# Patient Record
Sex: Male | Born: 1985
Health system: Southern US, Community
[De-identification: ages and names within clinical notes are randomized; demographics above are authoritative.]

## PROBLEM LIST (undated history)

## (undated) DIAGNOSIS — B20 Human immunodeficiency virus [HIV] disease: Secondary | ICD-10-CM

---
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
## 2006-03-30 ENCOUNTER — Ambulatory Visit (HOSPITAL_COMMUNITY): Payer: Self-pay | Admitting: Psychiatry

## 2015-04-02 ENCOUNTER — Emergency Department (HOSPITAL_COMMUNITY)
Admission: EM | Admit: 2015-04-02 | Discharge: 2015-04-02 | Disposition: A | Discharge status: Home / Self Care | Payer: BLUE CROSS/BLUE SHIELD | Attending: Emergency Medicine | Admitting: Emergency Medicine

## 2015-04-02 ENCOUNTER — Encounter (HOSPITAL_COMMUNITY): Payer: Self-pay | Admitting: Emergency Medicine

## 2015-04-02 DIAGNOSIS — S79922A Unspecified injury of left thigh, initial encounter: Secondary | ICD-10-CM | POA: Diagnosis present

## 2015-04-02 DIAGNOSIS — Z720 Tobacco use: Secondary | ICD-10-CM | POA: Diagnosis not present

## 2015-04-02 DIAGNOSIS — Y998 Other external cause status: Secondary | ICD-10-CM | POA: Insufficient documentation

## 2015-04-02 DIAGNOSIS — S70312A Abrasion, left thigh, initial encounter: Secondary | ICD-10-CM | POA: Diagnosis not present

## 2015-04-02 DIAGNOSIS — Y92410 Unspecified street and highway as the place of occurrence of the external cause: Secondary | ICD-10-CM | POA: Insufficient documentation

## 2015-04-02 DIAGNOSIS — Y9389 Activity, other specified: Secondary | ICD-10-CM | POA: Insufficient documentation

## 2015-04-02 MED ORDER — IBUPROFEN 800 MG PO TABS: 800.0000 mg | ORAL_TABLET | Freq: Three times a day (TID) | ORAL | Status: DC

## 2015-04-02 MED ORDER — METHOCARBAMOL 500 MG PO TABS: 1000.0000 mg | ORAL_TABLET | Freq: Three times a day (TID) | ORAL | Status: DC | PRN
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
## 2015-04-09 ENCOUNTER — Emergency Department (HOSPITAL_COMMUNITY)
Admission: EM | Admit: 2015-04-09 | Discharge: 2015-04-10 | Disposition: A | Discharge status: Home / Self Care | Payer: BLUE CROSS/BLUE SHIELD | Attending: Emergency Medicine | Admitting: Emergency Medicine

## 2015-04-09 ENCOUNTER — Encounter (HOSPITAL_COMMUNITY): Payer: Self-pay | Admitting: Emergency Medicine

## 2015-04-09 DIAGNOSIS — L0231 Cutaneous abscess of buttock: Secondary | ICD-10-CM | POA: Diagnosis present

## 2015-04-09 DIAGNOSIS — Z720 Tobacco use: Secondary | ICD-10-CM | POA: Insufficient documentation

## 2015-04-09 NOTE — ED Notes (Signed)
Pt states he has an abscess on his coccyx area for 3 days

## 2015-04-10 MED ORDER — HYDROCODONE-ACETAMINOPHEN 5-325 MG PO TABS: 2.0000 | ORAL_TABLET | ORAL | Status: DC | PRN

## 2015-04-10 MED ORDER — BUPIVACAINE-EPINEPHRINE (PF) 0.5% -1:200000 IJ SOLN
10.0000 mL | Freq: Once | INTRAMUSCULAR | Status: AC
  Administered 2015-04-10: 10 mL
  Filled 2015-04-10: qty 30

## 2015-04-10 MED ORDER — CEPHALEXIN 500 MG PO CAPS: 500.0000 mg | ORAL_CAPSULE | Freq: Four times a day (QID) | ORAL | Status: DC

## 2015-04-10 MED ORDER — OXYCODONE-ACETAMINOPHEN 5-325 MG PO TABS: 2.0000 | ORAL_TABLET | Freq: Once | ORAL | Status: DC

## 2015-04-10 MED ORDER — CEPHALEXIN 500 MG PO CAPS
500.0000 mg | ORAL_CAPSULE | Freq: Once | ORAL | Status: AC
  Administered 2015-04-10: 500 mg via ORAL
  Filled 2015-04-10: qty 1

## 2015-04-10 NOTE — ED Notes (Signed)
Suture cart placed at bedside. 

## 2015-04-10 NOTE — Discharge Instructions (Signed)
Abscess Take antibiotics as prescribed.  Apply warm compresses.  Recheck within 48 hours.  Return sooner if you have a fever, difficulty with bowel movement, rectal discharge, or if the abscess does not improve within 48 hours.  An abscess is an infected area that contains a collection of pus and debris.It can occur in almost any part of the body. An abscess is also known as a furuncle or boil. CAUSES  An abscess occurs when tissue gets infected. This can occur from blockage of oil or sweat glands, infection of hair follicles, or a minor injury to the skin. As the body tries to fight the infection, pus collects in the area and creates pressure under the skin. This pressure causes pain. People with weakened immune systems have difficulty fighting infections and get certain abscesses more often.  SYMPTOMS Usually an abscess develops on the skin and becomes a painful mass that is red, warm, and tender. If the abscess forms under the skin, you may feel a moveable soft area under the skin. Some abscesses break open (rupture) on their own, but most will continue to get worse without care. The infection can spread deeper into the body and eventually into the bloodstream, causing you to feel ill.  DIAGNOSIS  Your caregiver will take your medical history and perform a physical exam. A sample of fluid may also be taken from the abscess to determine what is causing your infection. TREATMENT  Your caregiver may prescribe antibiotic medicines to fight the infection. However, taking antibiotics alone usually does not cure an abscess. Your caregiver may need to make a small cut (incision) in the abscess to drain the pus. In some cases, gauze is packed into the abscess to reduce pain and to continue draining the area. HOME CARE INSTRUCTIONS   Only take over-the-counter or prescription medicines for pain, discomfort, or fever as directed by your caregiver.  If you were prescribed antibiotics, take them as directed.  Finish them even if you start to feel better.  If gauze is used, follow your caregiver's directions for changing the gauze.  To avoid spreading the infection:  Keep your draining abscess covered with a bandage.  Wash your hands well.  Do not share personal care items, towels, or whirlpools with others.  Avoid skin contact with others.  Keep your skin and clothes clean around the abscess.  Keep all follow-up appointments as directed by your caregiver. SEEK MEDICAL CARE IF:   You have increased pain, swelling, redness, fluid drainage, or bleeding.  You have muscle aches, chills, or a general ill feeling.  You have a fever. MAKE SURE YOU:   Understand these instructions.  Will watch your condition.  Will get help right away if you are not doing well or get worse. Document Released: 08/12/2005 Document Revised: 05/03/2012 Document Reviewed: 01/15/2012 [facility] Patient Information 2015 [place], [place]. This information is not intended to replace advice given to you by your health care provider. Make sure you discuss any questions you have with your health care provider.

## 2015-04-10 NOTE — ED Provider Notes (Signed)
CSN: [id number]     Arrival date & time 04/09/15  2209 History   None    Chief Complaint  Patient presents with  . Abscess     (Consider location/radiation/quality/duration/timing/severity/associated sxs/prior Treatment) HPI [name] is a 29 y.o male who presents for left buttox abscess that he noticed 4 days ago. He states it has worsened for the past 2 days.  Worse with sitting and walking.  He states that he tried to soak in a hot tub and apply warm compresses to the area.  He has had these before and the last one was on his abdomen. 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 He denies any fever, chills, abdominal pain, nausea, vomiting, difficulty with bowel or bladder movements, rectal pain, or rectal bleeding/drainage.  History reviewed. No pertinent past medical history. History reviewed. No pertinent past surgical history. Family History  Problem Relation Age of Onset  . Adopted: Yes   History  Substance Use Topics  . Smoking status: Current Every Day Smoker -- 1.00 packs/day    Types: Cigarettes  . Smokeless tobacco: Not on file  . Alcohol Use: No    Review of Systems  Constitutional: Negative for fever and chills.  Gastrointestinal: Negative for nausea, vomiting, abdominal pain, diarrhea, constipation, blood in stool, anal bleeding and rectal pain.  Skin: Positive for wound.      Allergies  Elocon  Home Medications   Prior to Admission medications   Medication Sig Start Date End Date Taking? Authorizing Provider  neomycin-bacitracin-polymyxin (NEOSPORIN) ointment Apply 1 application topically as needed for wound care. apply to eye   Yes Historical Provider, MD  cephALEXin (KEFLEX) 500 MG capsule Take 1 capsule (500 mg total) by mouth 4 (four) times daily. 04/10/15   Linzi Ohlinger Patel-Mills, PA-C  HYDROcodone-acetaminophen (NORCO/VICODIN) 5-325 MG per tablet Take 2 tablets by mouth every 4 (four) hours as needed. 04/10/15    Franci Oshana Patel-Mills, PA-C  ibuprofen (ADVIL,MOTRIN) 800 MG tablet Take 1 tablet (800 mg total) by mouth 3 (three) times daily. Patient not taking: Reported on 04/10/2015 04/02/15   Ladona MowJoe Mintz, PA-C  methocarbamol (ROBAXIN) 500 MG tablet Take 2 tablets (1,000 mg total) by mouth every 8 (eight) hours as needed for muscle spasms. Patient not taking: Reported on 04/10/2015 04/02/15   Ladona MowJoe Mintz, PA-C   BP 133/98 mmHg  Pulse 73  Temp(Src) 98.2 F (36.8 C) (Oral)  Resp 16  SpO2 100% Physical Exam  Constitutional: He is oriented to person, place, and time. He appears well-developed and well-nourished.  HENT:  Head: Normocephalic.  Eyes: Conjunctivae are normal.  Neck: Neck supple.  Cardiovascular: Normal rate.   Pulmonary/Chest: Effort normal.  Abdominal: Soft. There is no tenderness.  Musculoskeletal: Normal range of motion.  Neurological: He is alert and oriented to person, place, and time.  Skin: Skin is warm.  4cm round indurated area along the left gluteal fold with tenderness and warmth to palpation. No erythema or fluctuance.     ED Course  INCISION AND DRAINAGE Date/Time: 04/10/2015 1:40 PM Performed by: Catha GosselinPATEL-MILLS, Milton Streicher Authorized by: Catha GosselinPATEL-MILLS, Kenedee Molesky Consent: Verbal consent obtained. Risks and benefits: risks, benefits and alternatives were discussed Consent given by: patient Patient understanding: patient states understanding of the procedure being performed Patient consent: the patient's understanding of the procedure matches consent given Patient identity confirmed: verbally with patient Type: abscess Location: left gluteal fold. Anesthesia: local infiltration Local anesthetic: bupivacaine 0.5% with epinephrine Anesthetic  total: 2 ml Scalpel size: 11 Incision type: single straight Complexity: simple Drainage: bloody Drainage amount: scant Wound treatment: wound left open Packing material: none Patient tolerance: Patient tolerated the procedure well with no  immediate complications Comments: There was no purulent drainage.    (including critical care time) Labs Review Labs Reviewed - No data to display  Imaging Review No results found.   EKG Interpretation None      MDM   Final diagnoses:  Abscess, gluteal, left  Patient presents for left gluteal fold abscess x 4 days.  Patient refused rectal exam.  I thoroughly explained the need for the rectal exam. He stated he did not have rectal pain, rectal bleeding, rectal discharge or mucus, or difficulty passing stool.  I tried to lance the abscess but there was no drainage. I do not believe the abscess goes into the rectum at this time but is a likelihood if the abscess continues to grow. He is currently afebrile.   I put him on percocet for pain and keflex and gave him the first dose in the ED.  I explained that he needed to avoid wiping stool matter into the open incision.  He also needs close follow up within 48 hours. He should return for recheck within 24-48 hours as well as returning for worsening symptoms such as fever, no improvement in abscess, increase in size of abscess, or rectal pain.  He agreed with the plan.    Catha Gosselin, PA-C 04/10/15 1343  Tomasita Crumble, MD 04/10/15 [phone]

## 2015-04-10 NOTE — ED Notes (Signed)
PA at bedside.

## 2016-06-14 ENCOUNTER — Encounter (HOSPITAL_COMMUNITY): Payer: Self-pay

## 2016-06-14 ENCOUNTER — Emergency Department (HOSPITAL_COMMUNITY)
Admission: EM | Admit: 2016-06-14 | Discharge: 2016-06-14 | Disposition: A | Discharge status: Home / Self Care | Payer: BLUE CROSS/BLUE SHIELD | Attending: Emergency Medicine | Admitting: Emergency Medicine

## 2016-06-14 DIAGNOSIS — L02214 Cutaneous abscess of groin: Secondary | ICD-10-CM | POA: Insufficient documentation

## 2016-06-14 DIAGNOSIS — Z21 Asymptomatic human immunodeficiency virus [HIV] infection status: Secondary | ICD-10-CM | POA: Diagnosis not present

## 2016-06-14 DIAGNOSIS — Z792 Long term (current) use of antibiotics: Secondary | ICD-10-CM | POA: Insufficient documentation

## 2016-06-14 DIAGNOSIS — F17210 Nicotine dependence, cigarettes, uncomplicated: Secondary | ICD-10-CM | POA: Insufficient documentation

## 2016-06-14 DIAGNOSIS — Y929 Unspecified place or not applicable: Secondary | ICD-10-CM | POA: Insufficient documentation

## 2016-06-14 DIAGNOSIS — L0291 Cutaneous abscess, unspecified: Secondary | ICD-10-CM

## 2016-06-14 DIAGNOSIS — S00431A Contusion of right ear, initial encounter: Secondary | ICD-10-CM | POA: Insufficient documentation

## 2016-06-14 DIAGNOSIS — Z791 Long term (current) use of non-steroidal anti-inflammatories (NSAID): Secondary | ICD-10-CM | POA: Insufficient documentation

## 2016-06-14 DIAGNOSIS — Y999 Unspecified external cause status: Secondary | ICD-10-CM | POA: Insufficient documentation

## 2016-06-14 DIAGNOSIS — X58XXXA Exposure to other specified factors, initial encounter: Secondary | ICD-10-CM | POA: Diagnosis not present

## 2016-06-14 DIAGNOSIS — Y939 Activity, unspecified: Secondary | ICD-10-CM | POA: Diagnosis not present

## 2016-06-14 DIAGNOSIS — H9201 Otalgia, right ear: Secondary | ICD-10-CM | POA: Diagnosis present

## 2016-06-14 HISTORY — DX: Human immunodeficiency virus (HIV) disease: B20

## 2016-06-14 MED ORDER — HYDROCODONE-ACETAMINOPHEN 5-325 MG PO TABS: 1.0000 | ORAL_TABLET | Freq: Four times a day (QID) | ORAL | 0 refills | Status: DC | PRN

## 2016-06-14 MED ORDER — LIDOCAINE-EPINEPHRINE 2 %-1:100000 IJ SOLN: 10.0000 mL | Freq: Once | INTRAMUSCULAR | Status: DC

## 2016-06-14 MED ORDER — LIDOCAINE-EPINEPHRINE 2 %-1:100000 IJ SOLN
INTRAMUSCULAR | Status: AC
  Administered 2016-06-14: 10 mL
  Filled 2016-06-14: qty 1

## 2016-06-14 MED ORDER — BACITRACIN ZINC 500 UNIT/GM EX OINT
TOPICAL_OINTMENT | Freq: Two times a day (BID) | CUTANEOUS | Status: DC
  Administered 2016-06-14: 1 via TOPICAL
  Filled 2016-06-14: qty 28.35

## 2016-06-14 MED ORDER — OXYCODONE-ACETAMINOPHEN 5-325 MG PO TABS
1.0000 | ORAL_TABLET | Freq: Once | ORAL | Status: AC
  Administered 2016-06-14: 1 via ORAL
  Filled 2016-06-14: qty 1

## 2016-06-14 MED ORDER — NAPROXEN 500 MG PO TABS: 500.0000 mg | ORAL_TABLET | Freq: Two times a day (BID) | ORAL | 0 refills | Status: DC

## 2016-06-14 NOTE — ED Provider Notes (Signed)
[facility]-EMERGENCY DEPT Provider Note   CSN: [id number] Arrival date & time: 06/14/16  4098  First Provider Contact:  First MD Initiated Contact with Patient 06/14/16 2016        By signing my name below, I, Doreatha Martin, attest that this documentation has been prepared under the direction and in the presence of Shawn Joy, PA-C. Electronically Signed: Doreatha Martin, ED Scribe. 06/14/16. 8:58 PM.   History   Chief Complaint Chief Complaint  Patient presents with  . Abscess    HPI Ronald Peck is a 30 y.o. male with h/o HIV who presents to the Emergency Department complaining of a moderate, gradually worsening area of pain and swelling behind the right ear onset over a week ago. Pt also reports he had a painful, swollen area to the right groin, which was lanced 2.5 weeks ago. Pt states he was started on antibiotics at that time, but lost them, so he was then started on Doxycycline 3 days ago after being seen at [facility]. Pt states he was prescribed 10 days of Doxycycline. He reports the area behind his ear was evaluated at this time as well and they did not proceed with I&D. Pt states pain is worsened with palpation and direct pressure. He denies fever, chills, nausea, emesis, drainage from the area. He reports he is followed by [facility] for management of his HIV. Pt states he last had his viral load checked 4 months ago and he is not currently taking any antivirals because he refuses them. States his viral load and CD4 count were both "great," but cannot say what the actual values were.    The history is provided by the patient. No language interpreter was used.    Past Medical History:  Diagnosis Date  . HIV (human immunodeficiency virus infection) (HCC)     There are no active problems to display for this patient.   History reviewed. No pertinent surgical history.  OB History    No data available       Home Medications    Prior to Admission medications   Medication Sig  Start Date End Date Taking? Authorizing Provider  cephALEXin (KEFLEX) 500 MG capsule Take 1 capsule (500 mg total) by mouth 4 (four) times daily. 04/10/15   Hanna Patel-Mills, PA-C  HYDROcodone-acetaminophen (NORCO/VICODIN) 5-325 MG per tablet Take 2 tablets by mouth every 4 (four) hours as needed. 04/10/15   Hanna Patel-Mills, PA-C  HYDROcodone-acetaminophen (NORCO/VICODIN) 5-325 MG tablet Take 1 tablet by mouth every 6 (six) hours as needed. 06/14/16   Shawn C Joy, PA-C  ibuprofen (ADVIL,MOTRIN) 800 MG tablet Take 1 tablet (800 mg total) by mouth 3 (three) times daily. Patient not taking: Reported on 04/10/2015 04/02/15   Ladona Mow, PA-C  naproxen (NAPROSYN) 500 MG tablet Take 1 tablet (500 mg total) by mouth 2 (two) times daily. 06/14/16   Shawn C Joy, PA-C  neomycin-bacitracin-polymyxin (NEOSPORIN) ointment Apply 1 application topically as needed for wound care. apply to eye    Historical Provider, MD    Family History Family History  Problem Relation Age of Onset  . Adopted: Yes    Social History Social History  Substance Use Topics  . Smoking status: Current Every Day Smoker    Packs/day: 1.00    Types: Cigarettes  . Smokeless tobacco: Never Used  . Alcohol use No     Allergies   Elocon [mometasone furoate]   Review of Systems Review of Systems  Constitutional: Negative for chills, fever and  unexpected weight change.  Gastrointestinal: Negative for nausea and vomiting.  Skin:       +area of pain and swelling behind right ear, to right groin   All other systems reviewed and are negative.    Physical Exam Updated Vital Signs BP 127/76 (BP Location: Left Arm)   Pulse 73   Temp 98.6 F (37 C) (Oral)   Resp 20   SpO2 100%   Physical Exam  Constitutional: He appears well-developed and well-nourished. No distress.  HENT:  Head: Normocephalic and atraumatic.  Left Ear: External ear normal.  Just inferior to the right ear, he has tenderness and induration. Area of  swelling and tenderness to the right earlobe. No active drainage. No area of fluctuance.   Eyes: Conjunctivae are normal.  Neck: Neck supple.  Cardiovascular: Normal rate, regular rhythm, normal heart sounds and intact distal pulses.   Pulmonary/Chest: Effort normal and breath sounds normal. No respiratory distress.  Abdominal: Soft. He exhibits no distension. There is no tenderness. There is no guarding.  Musculoskeletal: Normal range of motion. He exhibits no edema or tenderness.  Lymphadenopathy:    He has no cervical adenopathy.       Right: No inguinal adenopathy present.       Left: No inguinal adenopathy present.  Neurological: He is alert.  Skin: Skin is warm and dry. He is not diaphoretic. There is erythema.  On the right groin, he has a small fluctuant abscess in the right inguinal region with associated tenderness and minimal surrounding erythema. Scribe served as Biomedical engineer during the exam.  Psychiatric: He has a normal mood and affect. His behavior is normal.  Nursing note and vitals reviewed.    ED Treatments / Results  Labs (all labs ordered are listed, but only abnormal results are displayed) Labs Reviewed - No data to display  EKG  EKG Interpretation None       Radiology No results found.  Procedures .Marland KitchenIncision and Drainage Date/Time: 06/14/2016 8:51 PM Performed by: Anselm Pancoast Authorized by: Anselm Pancoast   Consent:    Consent obtained:  Verbal   Consent given by:  Patient   Risks discussed:  Bleeding, incomplete drainage, infection and pain   Alternatives discussed:  No treatment Universal protocol:    Procedure explained and questions answered to patient or proxy's satisfaction: yes     Immediately prior to procedure a time out was called: yes     Patient identity confirmed:  Verbally with patient, arm band and hospital-assigned identification number Location:    Type:  Abscess   Location: right inguinal region. Pre-procedure details:    Skin  preparation:  Betadine Anesthesia (see MAR for exact dosages):    Anesthesia method:  Local infiltration   Local anesthetic:  Lidocaine 2% WITH epi Procedure type:    Complexity:  Complex Procedure details:    Needle aspiration: no     Incision types:  Single straight   Incision depth:  Subcutaneous   Scalpel blade:  11   Wound management:  Probed and deloculated   Drainage:  Bloody and purulent   Drainage amount:  Moderate   Wound treatment:  Wound left open   Packing materials:  None Post-procedure details:    Patient tolerance of procedure:  Tolerated well, no immediate complications   (including critical care time)  DIAGNOSTIC STUDIES: Oxygen Saturation is 100% on RA, normal by my interpretation.    COORDINATION OF CARE: 8:20 PM Discussed treatment plan with pt at bedside  which includes I&D and pt agreed to plan.    Medications Ordered in ED Medications  bacitracin ointment (1 application Topical Given 06/14/16 2115)  lidocaine-EPINEPHrine (XYLOCAINE W/EPI) 2 %-1:100000 (with pres) injection (10 mLs  Given by Other 06/14/16 2036)  oxyCODONE-acetaminophen (PERCOCET/ROXICET) 5-325 MG per tablet 1 tablet (1 tablet Oral Given 06/14/16 2115)     Initial Impression / Assessment and Plan / ED Course  I have reviewed the triage vital signs and the nursing notes.  Pertinent labs & imaging results that were available during my care of the patient were reviewed by me and considered in my medical decision making (see chart for details).  Clinical Course    Bronsyn Peck presents with 2 areas of pain, both arising over a week ago.  Groin abscess drained successfully. Chart review reveals patient's last HIV labs were performed April 2017. HIV Quant 771 and CD4 count 2300. He has what looks to be possibly what looks like a auricular hematoma on the right ear, but d/t the duration of time and location, ENT f/u is indicated. Patient has no signs of systemic illness, to include no signs of  sepsis. ENT follow-up resource given. The patient was given instructions for home care as well as return precautions. Patient voices understanding of these instructions, accepts the plan, and is comfortable with discharge.  Vitals:   06/14/16 1905 06/14/16 2121  BP: 152/88 127/76  Pulse: 97 73  Resp: 20 20  Temp: 98.6 F (37 C)   TempSrc: Oral   SpO2: 100% 100%     Final Clinical Impressions(s) / ED Diagnoses   Final diagnoses:  Abscess  Hematoma of auricle, right, initial encounter    New Prescriptions Discharge Medication List as of 06/14/2016  9:11 PM    START taking these medications   Details  !! HYDROcodone-acetaminophen (NORCO/VICODIN) 5-325 MG tablet Take 1 tablet by mouth every 6 (six) hours as needed., Starting Sun 06/14/2016, Print    naproxen (NAPROSYN) 500 MG tablet Take 1 tablet (500 mg total) by mouth 2 (two) times daily., Starting Sun 06/14/2016, Print     !! - Potential duplicate medications found. Please discuss with provider.      I personally performed the services described in this documentation, which was scribed in my presence. The recorded information has been reviewed and is accurate.    Anselm Pancoast, PA-C 06/14/16 2146    Tilden Fossa, MD 06/15/16 [phone]

## 2016-06-14 NOTE — Discharge Instructions (Signed)
Follow the attached instructions for care of the abscess following incision and drainage. Due to the location of the area of swelling near the ear, a course of antibiotics and ENT follow-up is indicated. Continue taking the doxycycline as previously prescribed. Follow up with ENT as soon as possible. Call the number provided to set up an appointment. Ibuprofen, naproxen, or Tylenol for pain. Vicodin for severe pain. Do not drive or perform other dangerous activities while taking the Vicodin.

## 2016-06-14 NOTE — ED Notes (Signed)
PT DISCHARGED. INSTRUCTIONS AND PRESCRIPTIONS GIVEN. AAOX4. PT IN NO APPARENT DISTRESS. THE OPPORTUNITY TO ASK QUESTIONS WAS PROVIDED. 

## 2016-06-14 NOTE — ED Triage Notes (Signed)
Two weeks ago pt had groin abscess.  Lanced and packed.  Continued problem with site.  Also site on ear.  Given antibiotics.  Doesn't like meds so did not take pain meds.  Ear site was said to have not come to a head so no lancing by urgent care.

## 2017-02-03 ENCOUNTER — Emergency Department (HOSPITAL_COMMUNITY)
Admission: EM | Admit: 2017-02-03 | Discharge: 2017-02-03 | Disposition: A | Discharge status: Home / Self Care | Payer: BLUE CROSS/BLUE SHIELD | Attending: Emergency Medicine | Admitting: Emergency Medicine

## 2017-02-03 ENCOUNTER — Emergency Department (HOSPITAL_COMMUNITY): Payer: BLUE CROSS/BLUE SHIELD

## 2017-02-03 ENCOUNTER — Encounter (HOSPITAL_COMMUNITY): Payer: Self-pay | Admitting: *Deleted

## 2017-02-03 DIAGNOSIS — W268XXA Contact with other sharp object(s), not elsewhere classified, initial encounter: Secondary | ICD-10-CM | POA: Insufficient documentation

## 2017-02-03 DIAGNOSIS — Y939 Activity, unspecified: Secondary | ICD-10-CM | POA: Diagnosis not present

## 2017-02-03 DIAGNOSIS — T1490XA Injury, unspecified, initial encounter: Secondary | ICD-10-CM

## 2017-02-03 DIAGNOSIS — Y929 Unspecified place or not applicable: Secondary | ICD-10-CM | POA: Diagnosis not present

## 2017-02-03 DIAGNOSIS — Z21 Asymptomatic human immunodeficiency virus [HIV] infection status: Secondary | ICD-10-CM | POA: Insufficient documentation

## 2017-02-03 DIAGNOSIS — Y999 Unspecified external cause status: Secondary | ICD-10-CM | POA: Insufficient documentation

## 2017-02-03 DIAGNOSIS — F17210 Nicotine dependence, cigarettes, uncomplicated: Secondary | ICD-10-CM | POA: Insufficient documentation

## 2017-02-03 DIAGNOSIS — S91111A Laceration without foreign body of right great toe without damage to nail, initial encounter: Secondary | ICD-10-CM | POA: Diagnosis present

## 2017-02-03 MED ORDER — LIDOCAINE HCL (PF) 1 % IJ SOLN
INTRAMUSCULAR | Status: AC
  Administered 2017-02-03: 5 mL via INTRADERMAL
  Filled 2017-02-03: qty 5

## 2017-02-03 MED ORDER — LIDOCAINE HCL (PF) 1 % IJ SOLN
5.0000 mL | Freq: Once | INTRAMUSCULAR | Status: AC
  Administered 2017-02-03: 5 mL via INTRADERMAL
  Filled 2017-02-03: qty 5

## 2017-02-03 MED ORDER — IBUPROFEN 800 MG PO TABS: 800.0000 mg | ORAL_TABLET | Freq: Three times a day (TID) | ORAL | 0 refills | Status: DC

## 2017-02-03 MED ORDER — OXYCODONE-ACETAMINOPHEN 5-325 MG PO TABS
1.0000 | ORAL_TABLET | Freq: Once | ORAL | Status: AC
  Administered 2017-02-03: 1 via ORAL
  Filled 2017-02-03: qty 1

## 2017-02-03 MED ORDER — LIDOCAINE HCL (PF) 1 % IJ SOLN
5.0000 mL | Freq: Once | INTRAMUSCULAR | Status: AC
  Administered 2017-02-03: 5 mL via INTRADERMAL

## 2017-02-03 MED ORDER — CEPHALEXIN 500 MG PO CAPS: 500.0000 mg | ORAL_CAPSULE | Freq: Three times a day (TID) | ORAL | 0 refills | Status: DC

## 2017-02-03 NOTE — Discharge Instructions (Signed)
Clean the wound with mild soap and water and keep it bandaged.  Sutures out in 10 days.  Return here for any signs of infection

## 2017-02-03 NOTE — ED Provider Notes (Signed)
[facility]-EMERGENCY DEPT Provider Note   CSN: [id number] Arrival date & time: 02/03/17  1206     History   Chief Complaint Chief Complaint  Patient presents with  . Toe Injury    HPI Ronald Peck is a 31 y.o. male.  HPI   Ronald Peck is a 31 y.o. male with PMHx of HIV, who presents to the Emergency Department complaining of laceration to his right third toe.  He states that he tripped on a piece of broken tile which cut his toe.  He describes a throbbing pain to the toe.  He denies numbness, swelling or difficulty moving the toe.  Td is up to date. Denies anti-coagulants.    Past Medical History:  Diagnosis Date  . HIV (human immunodeficiency virus infection) (HCC)     There are no active problems to display for this patient.   History reviewed. No pertinent surgical history.     Home Medications    Prior to Admission medications   Not on File    Family History Family History  Problem Relation Age of Onset  . Adopted: Yes    Social History Social History  Substance Use Topics  . Smoking status: Current Every Day Smoker    Packs/day: 1.00    Types: Cigarettes  . Smokeless tobacco: Never Used  . Alcohol use No     Allergies   Elocon [mometasone furoate]   Review of Systems Review of Systems  Constitutional: Negative for chills and fever.  Musculoskeletal: Negative for arthralgias (right third toe pain), back pain and joint swelling.  Skin: Positive for wound.       Laceration right third toe  Neurological: Negative for dizziness, weakness and numbness.  Hematological: Does not bruise/bleed easily.  All other systems reviewed and are negative.    Physical Exam Updated Vital Signs BP (!) 173/86   Pulse 63   Temp 97.9 F (36.6 C) (Oral)   Resp 16   Ht 6\' 5"  (1.956 m)   Wt (!) 147.4 kg   SpO2 98%   BMI 38.54 kg/m   Physical Exam  Constitutional: He is oriented to person, place, and time. He appears well-developed and well-nourished. No  distress.  HENT:  Head: Atraumatic.  Cardiovascular: Normal rate, regular rhythm and intact distal pulses.   No murmur heard. Pulmonary/Chest: Effort normal and breath sounds normal. No respiratory distress.  Musculoskeletal: Normal range of motion. He exhibits tenderness. He exhibits no edema or deformity.       Right foot: There is laceration. There is normal range of motion, no bony tenderness, no swelling, normal capillary refill and no deformity.       Feet:  Tenderness with ROM with 3.5 cm laceration to dorsal right third toe that extends into the web space.  Bleeding controlled. No tendon or bony deformity seen.  No FB's  Neurological: He is alert and oriented to person, place, and time. He exhibits normal muscle tone. Coordination normal.  Skin: Skin is warm.  Nursing note and vitals reviewed.    ED Treatments / Results  Labs (all labs ordered are listed, but only abnormal results are displayed) Labs Reviewed - No data to display  EKG  EKG Interpretation None       Radiology Dg Toe 3rd Right  Result Date: 02/03/2017 CLINICAL DATA:  Laceration. EXAM: RIGHT THIRD TOE COMPARISON:  No recent prior. FINDINGS: No acute bony or joint abnormality identified. No evidence of fracture or dislocation. IMPRESSION: No acute bony or joint  abnormality. Electronically Signed   By: Maisie Fus  Register   On: 02/03/2017 12:58    Procedures Procedures (including critical care time)  LACERATION REPAIR Performed by: Nitish Roes L. Authorized by: Maxwell Caul Consent: Verbal consent obtained. Risks and benefits: risks, benefits and alternatives were discussed Consent given by: patient Patient identity confirmed: provided demographic data Prepped and Draped in normal sterile fashion Wound explored  Laceration Location: right third toe  Laceration Length: 3.5 cm  No Foreign Bodies seen or palpated  Anesthesia: local infiltration  Local anesthetic: lidocaine 1 % w/o  epinephrine  Anesthetic total: 4 ml  Irrigation method: syringe Amount of cleaning: standard  Skin closure: 4-0 prolene  Number of sutures: 8  Technique: simple interrupted  Patient tolerance: Patient tolerated the procedure well with no immediate complications.   Medications Ordered in ED Medications  lidocaine (PF) (XYLOCAINE) 1 % injection (not administered)  oxyCODONE-acetaminophen (PERCOCET/ROXICET) 5-325 MG per tablet 1 tablet (1 tablet Oral Given 02/03/17 1344)  lidocaine (PF) (XYLOCAINE) 1 % injection 5 mL (5 mLs Intradermal Given 02/03/17 1346)     Initial Impression / Assessment and Plan / ED Course  I have reviewed the triage vital signs and the nursing notes.  Pertinent labs & imaging results that were available during my care of the patient were reviewed by me and considered in my medical decision making (see chart for details).     Pt well appearing.  NV intact.  Wound edges well approximated. Bleeding controlled prior to closure. XR neg for fx.  Pt is immunocompromised, so Rx for Keflex.  Wound bandaged and post op shoe applied.  Pt agrees to  Wound care instructions discussed.  sutures out in 10 days.    Final Clinical Impressions(s) / ED Diagnoses   Final diagnoses:  Laceration of right great toe without foreign body present or damage to nail, initial encounter    New Prescriptions New Prescriptions   No medications on file  ou   Rosey Bath 02/03/17 2117    Mancel Bale, MD 02/04/17 [phone]

## 2017-02-03 NOTE — ED Triage Notes (Signed)
Pt comes in by EMS for a toe laceration caused by a tile. Pt is up to date on his tetanus. Pt is HIV positive.

## 2018-04-14 IMAGING — DX DG TOE 3RD 2+V*R*
3 series · 3 of 3 positions shown · non-contrast
Comparison: No recent prior.

CLINICAL DATA: Laceration.

EXAM:
RIGHT THIRD TOE

[toe ap]
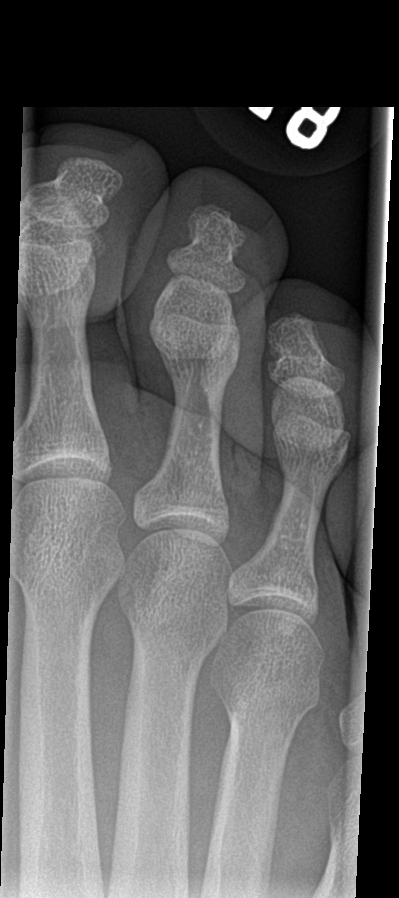

[toe obl]
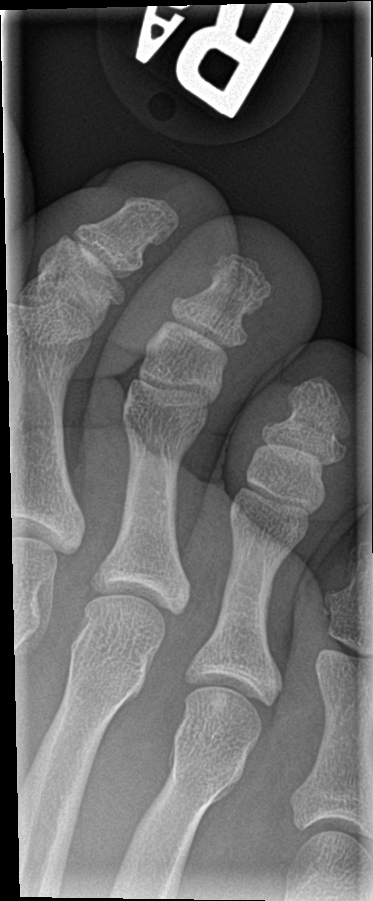

[toe lat]
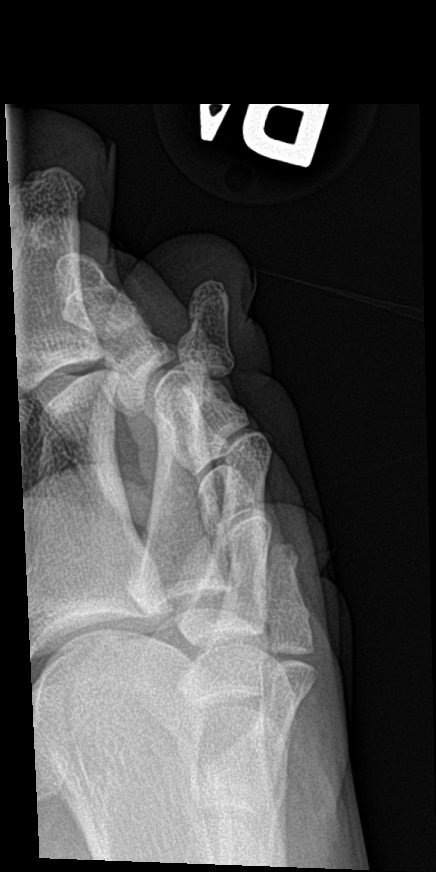

[3 of 3 positions shown; findings below may reference images not displayed]

FINDINGS: No acute bony or joint abnormality identified. No evidence of
fracture or dislocation.
IMPRESSION: No acute bony or joint abnormality.

## 2018-12-23 DIAGNOSIS — B999 Unspecified infectious disease: Secondary | ICD-10-CM | POA: Diagnosis not present

## 2018-12-29 DIAGNOSIS — B999 Unspecified infectious disease: Secondary | ICD-10-CM | POA: Diagnosis not present

## 2019-01-12 DIAGNOSIS — L0231 Cutaneous abscess of buttock: Secondary | ICD-10-CM | POA: Diagnosis not present

## 2019-01-12 DIAGNOSIS — L02419 Cutaneous abscess of limb, unspecified: Secondary | ICD-10-CM | POA: Diagnosis not present

## 2019-04-25 DIAGNOSIS — L03112 Cellulitis of left axilla: Secondary | ICD-10-CM | POA: Diagnosis not present

## 2019-04-25 DIAGNOSIS — L03315 Cellulitis of perineum: Secondary | ICD-10-CM | POA: Diagnosis not present

## 2019-05-17 DIAGNOSIS — L03221 Cellulitis of neck: Secondary | ICD-10-CM | POA: Diagnosis not present

## 2019-06-05 DIAGNOSIS — L03221 Cellulitis of neck: Secondary | ICD-10-CM | POA: Diagnosis not present

## 2019-07-23 DIAGNOSIS — L0231 Cutaneous abscess of buttock: Secondary | ICD-10-CM | POA: Diagnosis not present

## 2019-07-23 DIAGNOSIS — L738 Other specified follicular disorders: Secondary | ICD-10-CM | POA: Diagnosis not present

## 2019-07-25 DIAGNOSIS — L0231 Cutaneous abscess of buttock: Secondary | ICD-10-CM | POA: Diagnosis not present

## 2020-02-07 DIAGNOSIS — L02219 Cutaneous abscess of trunk, unspecified: Secondary | ICD-10-CM | POA: Diagnosis not present

## 2020-02-07 DIAGNOSIS — H70011 Subperiosteal abscess of mastoid, right ear: Secondary | ICD-10-CM | POA: Diagnosis not present

## 2020-02-07 DIAGNOSIS — L0501 Pilonidal cyst with abscess: Secondary | ICD-10-CM | POA: Diagnosis not present

## 2020-02-13 DIAGNOSIS — L0291 Cutaneous abscess, unspecified: Secondary | ICD-10-CM | POA: Diagnosis not present

## 2020-04-07 DIAGNOSIS — L02421 Furuncle of right axilla: Secondary | ICD-10-CM | POA: Diagnosis not present

## 2020-04-07 DIAGNOSIS — L02422 Furuncle of left axilla: Secondary | ICD-10-CM | POA: Diagnosis not present

## 2020-04-27 DIAGNOSIS — G510 Bell's palsy: Secondary | ICD-10-CM | POA: Diagnosis not present

## 2020-05-13 DIAGNOSIS — L03211 Cellulitis of face: Secondary | ICD-10-CM | POA: Diagnosis not present

## 2020-06-17 DIAGNOSIS — L03111 Cellulitis of right axilla: Secondary | ICD-10-CM | POA: Diagnosis not present

## 2020-06-27 DIAGNOSIS — L02421 Furuncle of right axilla: Secondary | ICD-10-CM | POA: Diagnosis not present

## 2020-07-17 DIAGNOSIS — Z20822 Contact with and (suspected) exposure to covid-19: Secondary | ICD-10-CM | POA: Diagnosis not present

## 2020-08-18 DIAGNOSIS — L0292 Furuncle, unspecified: Secondary | ICD-10-CM | POA: Diagnosis not present

## 2020-08-18 DIAGNOSIS — J069 Acute upper respiratory infection, unspecified: Secondary | ICD-10-CM | POA: Diagnosis not present

## 2020-08-18 DIAGNOSIS — Z03818 Encounter for observation for suspected exposure to other biological agents ruled out: Secondary | ICD-10-CM | POA: Diagnosis not present

## 2020-08-23 DIAGNOSIS — J189 Pneumonia, unspecified organism: Secondary | ICD-10-CM | POA: Diagnosis not present

## 2020-08-25 DIAGNOSIS — J040 Acute laryngitis: Secondary | ICD-10-CM | POA: Diagnosis not present

## 2020-08-25 DIAGNOSIS — J189 Pneumonia, unspecified organism: Secondary | ICD-10-CM | POA: Diagnosis not present

## 2020-09-26 ENCOUNTER — Emergency Department (HOSPITAL_COMMUNITY)
Admission: EM | Admit: 2020-09-26 | Discharge: 2020-09-26 | Disposition: A | Discharge status: Home / Self Care | Payer: BC Managed Care – PPO | Attending: Emergency Medicine | Admitting: Emergency Medicine

## 2020-09-26 ENCOUNTER — Encounter (HOSPITAL_COMMUNITY): Payer: Self-pay

## 2020-09-26 ENCOUNTER — Other Ambulatory Visit: Payer: Self-pay

## 2020-09-26 DIAGNOSIS — R739 Hyperglycemia, unspecified: Secondary | ICD-10-CM | POA: Diagnosis not present

## 2020-09-26 DIAGNOSIS — E119 Type 2 diabetes mellitus without complications: Secondary | ICD-10-CM | POA: Diagnosis not present

## 2020-09-26 DIAGNOSIS — F17210 Nicotine dependence, cigarettes, uncomplicated: Secondary | ICD-10-CM | POA: Insufficient documentation

## 2020-09-26 DIAGNOSIS — Z20822 Contact with and (suspected) exposure to covid-19: Secondary | ICD-10-CM | POA: Insufficient documentation

## 2020-09-26 DIAGNOSIS — E1165 Type 2 diabetes mellitus with hyperglycemia: Secondary | ICD-10-CM | POA: Diagnosis not present

## 2020-09-26 LAB — CBG MONITORING, ED
Glucose-Capillary: 367 mg/dL — ABNORMAL HIGH (ref 70–99)
Glucose-Capillary: 295 mg/dL — ABNORMAL HIGH (ref 70–99)
Glucose-Capillary: 308 mg/dL — ABNORMAL HIGH (ref 70–99)

## 2020-09-26 LAB — URINALYSIS, ROUTINE W REFLEX MICROSCOPIC
Bacteria, UA: NONE SEEN
Bilirubin Urine: NEGATIVE
Glucose, UA: >=500 mg/dL — AB
Ketones, ur: 5 mg/dL — AB
Leukocytes,Ua: NEGATIVE
Nitrite: NEGATIVE
Protein, ur: NEGATIVE mg/dL
Specific Gravity, Urine: 1.032 — ABNORMAL HIGH (ref 1.005–1.030)
pH: 6 (ref 5.0–8.0)

## 2020-09-26 LAB — BASIC METABOLIC PANEL
Anion gap: 11 (ref 5–15)
BUN: 13 mg/dL (ref 6–20)
CO2: 22 mmol/L (ref 22–32)
Calcium: 9.6 mg/dL (ref 8.9–10.3)
Chloride: 99 mmol/L (ref 98–111)
Creatinine, Ser: 0.76 mg/dL (ref 0.61–1.24)
GFR, Estimated: >60 mL/min (ref >60)
Glucose, Bld: 406 mg/dL — ABNORMAL HIGH (ref 70–99)
Potassium: 4.7 mmol/L (ref 3.5–5.1)
Sodium: 132 mmol/L — ABNORMAL LOW (ref 135–145)

## 2020-09-26 LAB — HEMOGLOBIN A1C
Hgb A1c MFr Bld: 9.8 % — ABNORMAL HIGH (ref 4.8–5.6)
Mean Plasma Glucose: 234.56 mg/dL

## 2020-09-26 LAB — CBC
HCT: 44.5 % (ref 39.0–52.0)
Hemoglobin: 16 g/dL (ref 13.0–17.0)
MCH: 30.4 pg (ref 26.0–34.0)
MCHC: 36 g/dL (ref 30.0–36.0)
MCV: 84.6 fL (ref 80.0–100.0)
Platelets: 238 10*3/uL (ref 150–400)
RBC: 5.26 MIL/uL (ref 4.22–5.81)
RDW: 12.4 % (ref 11.5–15.5)
WBC: 8.8 10*3/uL (ref 4.0–10.5)
nRBC: 0 % (ref 0.0–0.2)

## 2020-09-26 LAB — RESPIRATORY PANEL BY RT PCR (FLU A&B, COVID)
Influenza A by PCR: NEGATIVE
Influenza B by PCR: NEGATIVE
SARS Coronavirus 2 by RT PCR: NEGATIVE

## 2020-09-26 MED ORDER — SODIUM CHLORIDE 0.9 % IV BOLUS
1000.0000 mL | Freq: Once | INTRAVENOUS | Status: AC
  Administered 2020-09-26: 1000 mL via INTRAVENOUS

## 2020-09-26 MED ORDER — INSULIN ASPART 100 UNIT/ML ~~LOC~~ SOLN
5.0000 [IU] | Freq: Once | SUBCUTANEOUS | Status: AC
  Administered 2020-09-26: 5 [IU] via SUBCUTANEOUS
  Filled 2020-09-26: qty 0.05

## 2020-09-26 MED ORDER — METFORMIN HCL 500 MG PO TABS: 500.0000 mg | ORAL_TABLET | Freq: Two times a day (BID) | ORAL | 0 refills | Status: DC

## 2020-09-26 NOTE — ED Triage Notes (Signed)
Pt reports he started having blurred vision and has been peeing more frequently over the past few days. Pt does not have hx of diabetes. Pt checked his CBG with mother's glucometer and it read around 400.

## 2020-09-26 NOTE — Discharge Instructions (Signed)
Your symptom is likely related to new onset diabetes.  Please take Metformin as prescribed.  Use number below to find a primary care provider for close managements of your condition.  Please modified diet to help aid with better blood sugar control.  Return to the ED if you have any concern.

## 2020-09-26 NOTE — ED Provider Notes (Signed)
[facility]-EMERGENCY DEPT Provider Note   CSN: [id number] Arrival date & time: 09/26/20  1751     History Chief Complaint  Patient presents with  . Hyperglycemia    Ronald Peck is a 34 y.o. male.  The history is provided by the patient and medical records. No language interpreter was used.  Hyperglycemia    34 year old male presenting for evaluation of elevated blood sugar.  Patient report for the past 2 months he has been feeling increasingly fatigued as well as having increased thirst and increased urination which is unusual for him.  For the past 4 days he also started noticing blurry vision which is concerning.  He report his mom has diabetes and he has his blood sugar checked using her machine today and it was reading at 460.  He went to urgent care today and states the reading was even higher and patient was recommended to come to the ER for further evaluation.  He did report having pneumonia a month ago but that has been treated.  He currently denies any fever or chills no productive cough shortness of breath abdominal pain or rash.  When asked about his HIV status he mention many years ago he initially tested positive but a year and half later he retested it was negative and since then he has not thought much about it.  He has been fully vaccinated for COVID-19.  Past Medical History:  Diagnosis Date  . HIV (human immunodeficiency virus infection) (HCC)     There are no problems to display for this patient.   No past surgical history on file.     Family History  Adopted: Yes    Social History   Tobacco Use  . Smoking status: Current Every Day Smoker    Packs/day: 1.00    Types: Cigarettes  . Smokeless tobacco: Never Used  Substance Use Topics  . Alcohol use: No  . Drug use: Yes    Types: Marijuana    Home Medications Prior to Admission medications   Medication Sig Start Date End Date Taking? Authorizing Provider  cephALEXin (KEFLEX)  500 MG capsule Take 1 capsule (500 mg total) by mouth 3 (three) times daily. 02/03/17   Triplett, Tammy, PA-C  ibuprofen (ADVIL,MOTRIN) 800 MG tablet Take 1 tablet (800 mg total) by mouth 3 (three) times daily. 02/03/17   Triplett, Tammy, PA-C    Allergies    Elocon [mometasone furoate]  Review of Systems   Review of Systems  All other systems reviewed and are negative.   Physical Exam Updated Vital Signs BP (!) 153/100 (BP Location: Right Arm)   Pulse 97   Temp 98.6 F (37 C) (Oral)   Resp 19   SpO2 97%   Physical Exam Vitals and nursing note reviewed.  Constitutional:      General: He is not in acute distress.    Appearance: He is well-developed. He is obese.  HENT:     Head: Atraumatic.  Eyes:     Conjunctiva/sclera: Conjunctivae normal.  Cardiovascular:     Rate and Rhythm: Normal rate and regular rhythm.     Pulses: Normal pulses.     Heart sounds: Normal heart sounds.  Pulmonary:     Effort: Pulmonary effort is normal.     Breath sounds: Normal breath sounds.  Abdominal:     Palpations: Abdomen is soft.     Tenderness: There is no abdominal tenderness.  Musculoskeletal:        General: Normal  range of motion.     Cervical back: Neck supple.  Skin:    Findings: No rash.  Neurological:     Mental Status: He is alert and oriented to person, place, and time.  Psychiatric:        Mood and Affect: Mood normal.     ED Results / Procedures / Treatments   Labs (all labs ordered are listed, but only abnormal results are displayed) Labs Reviewed  BASIC METABOLIC PANEL - Abnormal; Notable for the following components:      Result Value   Sodium 132 (*)    Glucose, Bld 406 (*)    All other components within normal limits  URINALYSIS, ROUTINE W REFLEX MICROSCOPIC - Abnormal; Notable for the following components:   Specific Gravity, Urine 1.032 (*)    Glucose, UA >=500 (*)    Hgb urine dipstick SMALL (*)    Ketones, ur 5 (*)    All other components within normal  limits  CBG MONITORING, ED - Abnormal; Notable for the following components:   Glucose-Capillary 367 (*)    All other components within normal limits  CBG MONITORING, ED - Abnormal; Notable for the following components:   Glucose-Capillary 295 (*)    All other components within normal limits  CBG MONITORING, ED - Abnormal; Notable for the following components:   Glucose-Capillary 308 (*)    All other components within normal limits  RESPIRATORY PANEL BY RT PCR (FLU A&B, COVID)  CBC  HEMOGLOBIN A1C    EKG None  Radiology No results found.  Procedures Procedures (including critical care time)  Medications Ordered in ED Medications  sodium chloride 0.9 % bolus 1,000 mL (0 mLs Intravenous Stopped 09/26/20 1941)  insulin aspart (novoLOG) injection 5 Units (5 Units Subcutaneous Given 09/26/20 1850)    ED Course  I have reviewed the triage vital signs and the nursing notes.  Pertinent labs & imaging results that were available during my care of the patient were reviewed by me and considered in my medical decision making (see chart for details).    MDM Rules/Calculators/A&P                          BP 123/77 (BP Location: Left Arm)   Pulse 83   Temp 98.3 F (36.8 C) (Oral)   Resp 18   SpO2 97%   Final Clinical Impression(s) / ED Diagnoses Final diagnoses:  New onset type 2 diabetes mellitus (HCC)    Rx / DC Orders ED Discharge Orders         Ordered    metFORMIN (GLUCOPHAGE) 500 MG tablet  2 times daily with meals        09/26/20 2220         6:37 PM Patient here with polyuria, polydipsia, and blurry vision along with generalized fatigue for the past 2 months but worse in the past 4 days.  Had a CBG greater than 400 at home.  CBG here is 367.  He does not have a PCP and in the setting of symptoms consistent with diabetes, will check labs to assess for potential DKA.  May consider patient for further management of his diabetes if he is in DKA as he does not have a  PCP.  9:10 PM Covid is negative, labs are  reassuring.  CBG did improve with this IV fluid and with 5 units of insulin.  UA shows 5 ketones however normal anion gap.  No  evidence of DKA.  In the setting of no signs of DKA and patient is not actively vomiting, I did consult [facility] hospitalist who felt patient does demonstrate benefit from admission at this time.  Will consult social work to see if they can help patient set up an appointment with PCP.  Will discharge home with Metformin.  Will order A1c.   Fayrene Helper, PA-C 09/26/20 2222    Gwyneth Sprout, MD 10/03/20 [phone]

## 2020-10-13 DIAGNOSIS — R21 Rash and other nonspecific skin eruption: Secondary | ICD-10-CM | POA: Diagnosis not present

## 2020-10-30 DIAGNOSIS — B20 Human immunodeficiency virus [HIV] disease: Secondary | ICD-10-CM | POA: Diagnosis not present

## 2020-10-30 DIAGNOSIS — E1165 Type 2 diabetes mellitus with hyperglycemia: Secondary | ICD-10-CM | POA: Diagnosis not present

## 2020-12-16 DIAGNOSIS — B20 Human immunodeficiency virus [HIV] disease: Secondary | ICD-10-CM | POA: Diagnosis not present

## 2020-12-31 DIAGNOSIS — B20 Human immunodeficiency virus [HIV] disease: Secondary | ICD-10-CM | POA: Diagnosis not present

## 2020-12-31 DIAGNOSIS — R945 Abnormal results of liver function studies: Secondary | ICD-10-CM | POA: Diagnosis not present

## 2020-12-31 DIAGNOSIS — E1165 Type 2 diabetes mellitus with hyperglycemia: Secondary | ICD-10-CM | POA: Diagnosis not present

## 2020-12-31 DIAGNOSIS — Z720 Tobacco use: Secondary | ICD-10-CM | POA: Diagnosis not present

## 2021-02-09 DIAGNOSIS — L03211 Cellulitis of face: Secondary | ICD-10-CM | POA: Diagnosis not present

## 2021-03-27 DIAGNOSIS — H00012 Hordeolum externum right lower eyelid: Secondary | ICD-10-CM | POA: Diagnosis not present

## 2021-04-12 DIAGNOSIS — U071 COVID-19: Secondary | ICD-10-CM | POA: Diagnosis not present

## 2021-04-12 DIAGNOSIS — R059 Cough, unspecified: Secondary | ICD-10-CM | POA: Diagnosis not present

## 2021-06-04 DIAGNOSIS — L988 Other specified disorders of the skin and subcutaneous tissue: Secondary | ICD-10-CM | POA: Diagnosis not present

## 2021-06-04 DIAGNOSIS — L03011 Cellulitis of right finger: Secondary | ICD-10-CM | POA: Diagnosis not present

## 2021-06-06 DIAGNOSIS — B20 Human immunodeficiency virus [HIV] disease: Secondary | ICD-10-CM | POA: Diagnosis not present

## 2021-06-18 DIAGNOSIS — L02411 Cutaneous abscess of right axilla: Secondary | ICD-10-CM | POA: Diagnosis not present

## 2021-06-30 DIAGNOSIS — E1165 Type 2 diabetes mellitus with hyperglycemia: Secondary | ICD-10-CM | POA: Diagnosis not present

## 2021-06-30 DIAGNOSIS — L0292 Furuncle, unspecified: Secondary | ICD-10-CM | POA: Diagnosis not present

## 2021-07-01 DIAGNOSIS — B009 Herpesviral infection, unspecified: Secondary | ICD-10-CM | POA: Diagnosis not present

## 2021-07-07 DIAGNOSIS — H6692 Otitis media, unspecified, left ear: Secondary | ICD-10-CM | POA: Diagnosis not present

## 2021-07-07 DIAGNOSIS — H60393 Other infective otitis externa, bilateral: Secondary | ICD-10-CM | POA: Diagnosis not present

## 2021-07-27 DIAGNOSIS — H6092 Unspecified otitis externa, left ear: Secondary | ICD-10-CM | POA: Diagnosis not present

## 2021-11-19 DIAGNOSIS — B20 Human immunodeficiency virus [HIV] disease: Secondary | ICD-10-CM | POA: Diagnosis not present

## 2021-12-10 DIAGNOSIS — H60501 Unspecified acute noninfective otitis externa, right ear: Secondary | ICD-10-CM | POA: Diagnosis not present

## 2021-12-10 DIAGNOSIS — L03211 Cellulitis of face: Secondary | ICD-10-CM | POA: Diagnosis not present

## 2021-12-17 DIAGNOSIS — Z79899 Other long term (current) drug therapy: Secondary | ICD-10-CM | POA: Diagnosis not present

## 2021-12-17 DIAGNOSIS — K7581 Nonalcoholic steatohepatitis (NASH): Secondary | ICD-10-CM | POA: Diagnosis not present

## 2021-12-17 DIAGNOSIS — R739 Hyperglycemia, unspecified: Secondary | ICD-10-CM | POA: Diagnosis not present

## 2021-12-17 DIAGNOSIS — R160 Hepatomegaly, not elsewhere classified: Secondary | ICD-10-CM | POA: Diagnosis not present

## 2021-12-17 DIAGNOSIS — M79671 Pain in right foot: Secondary | ICD-10-CM | POA: Diagnosis not present

## 2021-12-17 DIAGNOSIS — Z114 Encounter for screening for human immunodeficiency virus [HIV]: Secondary | ICD-10-CM | POA: Diagnosis not present

## 2021-12-17 DIAGNOSIS — K760 Fatty (change of) liver, not elsewhere classified: Secondary | ICD-10-CM | POA: Diagnosis not present

## 2021-12-17 DIAGNOSIS — E8889 Other specified metabolic disorders: Secondary | ICD-10-CM | POA: Diagnosis not present

## 2021-12-17 DIAGNOSIS — B20 Human immunodeficiency virus [HIV] disease: Secondary | ICD-10-CM | POA: Diagnosis not present

## 2022-01-05 DIAGNOSIS — K7460 Unspecified cirrhosis of liver: Secondary | ICD-10-CM | POA: Diagnosis not present

## 2022-01-05 DIAGNOSIS — K7581 Nonalcoholic steatohepatitis (NASH): Secondary | ICD-10-CM | POA: Diagnosis not present

## 2022-01-05 DIAGNOSIS — K7402 Hepatic fibrosis, advanced fibrosis: Secondary | ICD-10-CM | POA: Diagnosis not present

## 2022-01-05 DIAGNOSIS — E8889 Other specified metabolic disorders: Secondary | ICD-10-CM | POA: Diagnosis not present

## 2022-01-05 DIAGNOSIS — K7400 Hepatic fibrosis, unspecified: Secondary | ICD-10-CM | POA: Diagnosis not present

## 2022-01-07 DIAGNOSIS — H60592 Other noninfective acute otitis externa, left ear: Secondary | ICD-10-CM | POA: Diagnosis not present

## 2022-01-16 DIAGNOSIS — L0292 Furuncle, unspecified: Secondary | ICD-10-CM | POA: Diagnosis not present

## 2022-01-16 DIAGNOSIS — H66003 Acute suppurative otitis media without spontaneous rupture of ear drum, bilateral: Secondary | ICD-10-CM | POA: Diagnosis not present

## 2022-01-27 DIAGNOSIS — H9202 Otalgia, left ear: Secondary | ICD-10-CM | POA: Diagnosis not present

## 2022-01-27 DIAGNOSIS — R21 Rash and other nonspecific skin eruption: Secondary | ICD-10-CM | POA: Diagnosis not present

## 2022-03-13 DIAGNOSIS — H66003 Acute suppurative otitis media without spontaneous rupture of ear drum, bilateral: Secondary | ICD-10-CM | POA: Diagnosis not present

## 2022-03-13 DIAGNOSIS — L738 Other specified follicular disorders: Secondary | ICD-10-CM | POA: Diagnosis not present

## 2022-04-01 DIAGNOSIS — H1089 Other conjunctivitis: Secondary | ICD-10-CM | POA: Diagnosis not present

## 2022-04-07 DIAGNOSIS — I10 Essential (primary) hypertension: Secondary | ICD-10-CM | POA: Diagnosis not present

## 2022-04-07 DIAGNOSIS — Z887 Allergy status to serum and vaccine status: Secondary | ICD-10-CM | POA: Diagnosis not present

## 2022-04-07 DIAGNOSIS — Z6839 Body mass index (BMI) 39.0-39.9, adult: Secondary | ICD-10-CM | POA: Diagnosis not present

## 2022-04-07 DIAGNOSIS — R7989 Other specified abnormal findings of blood chemistry: Secondary | ICD-10-CM | POA: Diagnosis not present

## 2022-04-07 DIAGNOSIS — E669 Obesity, unspecified: Secondary | ICD-10-CM | POA: Diagnosis not present

## 2022-04-07 DIAGNOSIS — R768 Other specified abnormal immunological findings in serum: Secondary | ICD-10-CM | POA: Diagnosis not present

## 2022-04-07 DIAGNOSIS — E785 Hyperlipidemia, unspecified: Secondary | ICD-10-CM | POA: Diagnosis not present

## 2022-04-07 DIAGNOSIS — E119 Type 2 diabetes mellitus without complications: Secondary | ICD-10-CM | POA: Diagnosis not present

## 2022-04-07 DIAGNOSIS — R160 Hepatomegaly, not elsewhere classified: Secondary | ICD-10-CM | POA: Diagnosis not present

## 2022-04-07 DIAGNOSIS — Z21 Asymptomatic human immunodeficiency virus [HIV] infection status: Secondary | ICD-10-CM | POA: Diagnosis not present

## 2022-04-07 DIAGNOSIS — F17210 Nicotine dependence, cigarettes, uncomplicated: Secondary | ICD-10-CM | POA: Diagnosis not present

## 2022-04-07 DIAGNOSIS — E8889 Other specified metabolic disorders: Secondary | ICD-10-CM | POA: Diagnosis not present

## 2022-04-07 DIAGNOSIS — F1290 Cannabis use, unspecified, uncomplicated: Secondary | ICD-10-CM | POA: Diagnosis not present

## 2022-04-07 DIAGNOSIS — K760 Fatty (change of) liver, not elsewhere classified: Secondary | ICD-10-CM | POA: Diagnosis not present

## 2022-05-04 DIAGNOSIS — M25551 Pain in right hip: Secondary | ICD-10-CM | POA: Diagnosis not present

## 2022-05-04 DIAGNOSIS — Y30XXXA Falling, jumping or pushed from a high place, undetermined intent, initial encounter: Secondary | ICD-10-CM | POA: Diagnosis not present

## 2022-05-05 DIAGNOSIS — W01198A Fall on same level from slipping, tripping and stumbling with subsequent striking against other object, initial encounter: Secondary | ICD-10-CM | POA: Diagnosis not present

## 2022-05-05 DIAGNOSIS — M549 Dorsalgia, unspecified: Secondary | ICD-10-CM | POA: Diagnosis not present

## 2022-05-05 DIAGNOSIS — M545 Low back pain, unspecified: Secondary | ICD-10-CM | POA: Diagnosis not present

## 2022-05-05 DIAGNOSIS — G8911 Acute pain due to trauma: Secondary | ICD-10-CM | POA: Diagnosis not present

## 2022-05-05 DIAGNOSIS — R296 Repeated falls: Secondary | ICD-10-CM | POA: Diagnosis not present

## 2022-05-05 DIAGNOSIS — R079 Chest pain, unspecified: Secondary | ICD-10-CM | POA: Diagnosis not present

## 2022-05-05 DIAGNOSIS — M25561 Pain in right knee: Secondary | ICD-10-CM | POA: Diagnosis not present

## 2022-05-05 DIAGNOSIS — R102 Pelvic and perineal pain: Secondary | ICD-10-CM | POA: Diagnosis not present

## 2022-05-05 DIAGNOSIS — S8991XA Unspecified injury of right lower leg, initial encounter: Secondary | ICD-10-CM | POA: Diagnosis not present

## 2022-05-05 DIAGNOSIS — M25551 Pain in right hip: Secondary | ICD-10-CM | POA: Diagnosis not present

## 2022-05-05 DIAGNOSIS — M7918 Myalgia, other site: Secondary | ICD-10-CM | POA: Diagnosis not present

## 2022-06-20 DIAGNOSIS — L03012 Cellulitis of left finger: Secondary | ICD-10-CM | POA: Diagnosis not present

## 2022-06-20 DIAGNOSIS — L02431 Carbuncle of right axilla: Secondary | ICD-10-CM | POA: Diagnosis not present

## 2022-06-20 DIAGNOSIS — H6123 Impacted cerumen, bilateral: Secondary | ICD-10-CM | POA: Diagnosis not present

## 2022-07-08 DIAGNOSIS — Z887 Allergy status to serum and vaccine status: Secondary | ICD-10-CM | POA: Diagnosis not present

## 2022-07-08 DIAGNOSIS — K760 Fatty (change of) liver, not elsewhere classified: Secondary | ICD-10-CM | POA: Diagnosis not present

## 2022-07-08 DIAGNOSIS — Z79899 Other long term (current) drug therapy: Secondary | ICD-10-CM | POA: Diagnosis not present

## 2022-07-08 DIAGNOSIS — E1165 Type 2 diabetes mellitus with hyperglycemia: Secondary | ICD-10-CM | POA: Diagnosis not present

## 2022-07-08 DIAGNOSIS — Z888 Allergy status to other drugs, medicaments and biological substances status: Secondary | ICD-10-CM | POA: Diagnosis not present

## 2022-07-08 DIAGNOSIS — B20 Human immunodeficiency virus [HIV] disease: Secondary | ICD-10-CM | POA: Diagnosis not present

## 2022-07-08 DIAGNOSIS — R7401 Elevation of levels of liver transaminase levels: Secondary | ICD-10-CM | POA: Diagnosis not present

## 2022-07-14 DIAGNOSIS — K7402 Hepatic fibrosis, advanced fibrosis: Secondary | ICD-10-CM | POA: Diagnosis not present

## 2022-07-24 DIAGNOSIS — L02212 Cutaneous abscess of back [any part, except buttock]: Secondary | ICD-10-CM | POA: Diagnosis not present

## 2022-07-24 DIAGNOSIS — R21 Rash and other nonspecific skin eruption: Secondary | ICD-10-CM | POA: Diagnosis not present

## 2022-07-29 ENCOUNTER — Encounter (HOSPITAL_COMMUNITY): Payer: Self-pay | Admitting: Pharmacy Technician

## 2022-07-29 ENCOUNTER — Emergency Department (HOSPITAL_COMMUNITY)
Admission: EM | Admit: 2022-07-29 | Discharge: 2022-07-29 | Disposition: A | Discharge status: Home / Self Care | Payer: BC Managed Care – PPO | Attending: Emergency Medicine | Admitting: Emergency Medicine

## 2022-07-29 ENCOUNTER — Other Ambulatory Visit: Payer: Self-pay

## 2022-07-29 DIAGNOSIS — R739 Hyperglycemia, unspecified: Secondary | ICD-10-CM | POA: Diagnosis not present

## 2022-07-29 DIAGNOSIS — Z7984 Long term (current) use of oral hypoglycemic drugs: Secondary | ICD-10-CM | POA: Diagnosis not present

## 2022-07-29 DIAGNOSIS — Z21 Asymptomatic human immunodeficiency virus [HIV] infection status: Secondary | ICD-10-CM | POA: Diagnosis not present

## 2022-07-29 DIAGNOSIS — E1165 Type 2 diabetes mellitus with hyperglycemia: Secondary | ICD-10-CM | POA: Diagnosis not present

## 2022-07-29 LAB — BASIC METABOLIC PANEL
Anion gap: 9 (ref 5–15)
BUN: 21 mg/dL — ABNORMAL HIGH (ref 6–20)
CO2: 22 mmol/L (ref 22–32)
Calcium: 9.4 mg/dL (ref 8.9–10.3)
Chloride: 102 mmol/L (ref 98–111)
Creatinine, Ser: 1.22 mg/dL (ref 0.61–1.24)
GFR, Estimated: >60 mL/min (ref >60)
Glucose, Bld: 452 mg/dL — ABNORMAL HIGH (ref 70–99)
Potassium: 4 mmol/L (ref 3.5–5.1)
Sodium: 133 mmol/L — ABNORMAL LOW (ref 135–145)

## 2022-07-29 LAB — URINALYSIS, ROUTINE W REFLEX MICROSCOPIC
Bacteria, UA: NONE SEEN
Bilirubin Urine: NEGATIVE
Glucose, UA: >=500 mg/dL — AB
Ketones, ur: NEGATIVE mg/dL
Leukocytes,Ua: NEGATIVE
Nitrite: NEGATIVE
Protein, ur: NEGATIVE mg/dL
Specific Gravity, Urine: 1.029 (ref 1.005–1.030)
pH: 6 (ref 5.0–8.0)

## 2022-07-29 LAB — CBC WITH DIFFERENTIAL/PLATELET
Abs Immature Granulocytes: 0.04 10*3/uL (ref 0.00–0.07)
Basophils Absolute: 0.1 10*3/uL (ref 0.0–0.1)
Basophils Relative: 1 %
Eosinophils Absolute: 0.2 10*3/uL (ref 0.0–0.5)
Eosinophils Relative: 2 %
HCT: 45.2 % (ref 39.0–52.0)
Hemoglobin: 16.4 g/dL (ref 13.0–17.0)
Immature Granulocytes: 0 %
Lymphocytes Relative: 24 %
Lymphs Abs: 2.1 10*3/uL (ref 0.7–4.0)
MCH: 32 pg (ref 26.0–34.0)
MCHC: 36.3 g/dL — ABNORMAL HIGH (ref 30.0–36.0)
MCV: 88.1 fL (ref 80.0–100.0)
Monocytes Absolute: 0.6 10*3/uL (ref 0.1–1.0)
Monocytes Relative: 7 %
Neutro Abs: 5.9 10*3/uL (ref 1.7–7.7)
Neutrophils Relative %: 66 %
Platelets: 316 10*3/uL (ref 150–400)
RBC: 5.13 MIL/uL (ref 4.22–5.81)
RDW: 12.4 % (ref 11.5–15.5)
WBC: 8.9 10*3/uL (ref 4.0–10.5)
nRBC: 0 % (ref 0.0–0.2)

## 2022-07-29 LAB — BLOOD GAS, VENOUS
Acid-Base Excess: 3.1 mmol/L — ABNORMAL HIGH (ref 0.0–2.0)
Bicarbonate: 27 mmol/L (ref 20.0–28.0)
O2 Saturation: 99.6 %
Patient temperature: 37
pCO2, Ven: 38 mmHg — ABNORMAL LOW (ref 44–60)
pH, Ven: 7.46 — ABNORMAL HIGH (ref 7.25–7.43)
pO2, Ven: 176 mmHg — ABNORMAL HIGH (ref 32–45)

## 2022-07-29 LAB — CBG MONITORING, ED: Glucose-Capillary: 429 mg/dL — ABNORMAL HIGH (ref 70–99)

## 2022-07-29 MED ORDER — SODIUM CHLORIDE 0.9 % IV BOLUS
1000.0000 mL | Freq: Once | INTRAVENOUS | Status: AC
  Administered 2022-07-29: 1000 mL via INTRAVENOUS

## 2022-07-29 MED ORDER — METFORMIN HCL 500 MG PO TABS
500.0000 mg | ORAL_TABLET | Freq: Once | ORAL | Status: AC
  Administered 2022-07-29: 500 mg via ORAL
  Filled 2022-07-29: qty 1

## 2022-07-29 MED ORDER — METFORMIN HCL 500 MG PO TABS: 500.0000 mg | ORAL_TABLET | Freq: Every day | ORAL | 0 refills | Status: AC

## 2022-07-29 NOTE — Discharge Instructions (Signed)
Take the metformin to help with your hyperglycemia.  Monitor your blood sugar to make sure you do not have any episodes of recurrent low blood sugars.  You may need to continue the metformin as soon as you stop the prednisone.  Follow-up with your primary care doctor to make sure your blood sugars returned to normal

## 2022-07-29 NOTE — ED Triage Notes (Signed)
Pt here with dizziness and lightheadedness intermittently. Pt endorses polyuria for the last few weeks. Pt states CBG at home was 600.

## 2022-07-29 NOTE — ED Provider Triage Note (Signed)
Emergency Medicine Provider Triage Evaluation Note  Ronald Peck , a 36 y.o. male  was evaluated in triage.  Pt complains of fatigue.  Endorse polyuria/polydipsia, fatigue and lightheadedness x 3 days.  Recently had an abscess to upper back [facility] at [facility] and was placed on prednisone, and bactrim.  Still has 2 days left of steroid.  Was mowing today when became lightheaded.  Denies fever, chills, cp, sob, abd pain, n/v/d.  HIV well controlled  Review of Systems  Positive: As above Negative: As above  Physical Exam  BP (!) 171/100 (BP Location: Right Arm)   Pulse 70   Temp 99.1 F (37.3 C) (Oral)   Resp 18   Ht 6\' 5"  (1.956 m)   Wt (!) 152.9 kg   SpO2 98%   BMI 39.96 kg/m  Gen:   Awake, no distress   Resp:  Normal effort  MSK:   Moves extremities without difficulty  Other:    Medical Decision Making  Medically screening exam initiated at 4:05 PM.  Appropriate orders placed.  Ronald Peck was informed that the remainder of the evaluation will be completed by another provider, this initial triage assessment does not replace that evaluation, and the importance of remaining in the ED until their evaluation is complete.     Bonnell Public, PA-C 07/29/22 1612

## 2022-07-29 NOTE — ED Provider Notes (Signed)
[facility] DEPT Provider Note   CSN: [id number] Arrival date & time: 07/29/22  1541     History  Chief Complaint  Patient presents with   Hyperglycemia    Ronald Peck is a 36 y.o. male.   Hyperglycemia    Patient presents to the ED for evaluation of high blood sugar.  He does have a history of diabetes and HIV.  Patient is no longer on any medications.  Patient states he had a rash on his skin.  He was seen at an urgent care and was started on prednisone.  He was also started on antibiotics for a boil.  Patient has 2 days left of the steroids.  Patient's has noticed for the last several days he has been lightheaded.  He has had some polyuria and polydipsia.  He checked his blood sugar and it was up to 600.  He denies any abdominal pain.  No fevers or chills.  He used to be on medications for diabetes.  Patient states he changed his diet and his doctor took him off metformin because he was having low blood sugar.  Patient states his blood sugar has been controlled for over a year.  Went to an urgent care today and they suggested ED evaluation  Home Medications Prior to Admission medications   Medication Sig Start Date End Date Taking? Authorizing Provider  BIKTARVY 50-200-25 MG TABS tablet Take 1 tablet by mouth daily. 07/08/22  Yes [provider]  mupirocin ointment (BACTROBAN) 2 % Apply 1 Application topically 2 (two) times daily. 07/24/22  Yes [provider]  sulfamethoxazole-trimethoprim (BACTRIM DS) 800-160 MG tablet Take 1 tablet by mouth 2 (two) times daily. 07/24/22  Yes [provider]  metFORMIN (GLUCOPHAGE) 500 MG tablet Take 1 tablet (500 mg total) by mouth daily with breakfast. 07/29/22   Dorie Rank, MD      Allergies    Covid-19 (adenovirus) vaccine and Elocon [mometasone furoate]    Review of Systems   Review of Systems  Physical Exam Updated Vital Signs BP (!) 146/68   Pulse 69   Temp 99.1 F (37.3 C)  (Oral)   Resp 18   Ht 1.956 m (6\' 5" )   Wt (!) 152.9 kg   SpO2 92%   BMI 39.96 kg/m  Physical Exam Vitals and nursing note reviewed.  Constitutional:      Appearance: He is well-developed. He is not diaphoretic.  HENT:     Head: Normocephalic and atraumatic.     Right Ear: External ear normal.     Left Ear: External ear normal.  Eyes:     General: No scleral icterus.       Right eye: No discharge.        Left eye: No discharge.     Conjunctiva/sclera: Conjunctivae normal.  Neck:     Trachea: No tracheal deviation.  Cardiovascular:     Rate and Rhythm: Normal rate and regular rhythm.  Pulmonary:     Effort: Pulmonary effort is normal. No respiratory distress.     Breath sounds: Normal breath sounds. No stridor. No wheezing or rales.  Abdominal:     General: Bowel sounds are normal. There is no distension.     Palpations: Abdomen is soft.     Tenderness: There is no abdominal tenderness. There is no guarding or rebound.  Musculoskeletal:        General: No tenderness or deformity.     Cervical back: Neck supple.  Skin:  General: Skin is warm and dry.     Findings: No rash.  Neurological:     General: No focal deficit present.     Mental Status: He is alert.     Cranial Nerves: No cranial nerve deficit (no facial droop, extraocular movements intact, no slurred speech).     Sensory: No sensory deficit.     Motor: No abnormal muscle tone or seizure activity.     Coordination: Coordination normal.  Psychiatric:        Mood and Affect: Mood normal.     ED Results / Procedures / Treatments   Labs (all labs ordered are listed, but only abnormal results are displayed) Labs Reviewed  CBC WITH DIFFERENTIAL/PLATELET - Abnormal; Notable for the following components:      Result Value   MCHC 36.3 (*)    All other components within normal limits  URINALYSIS, ROUTINE W REFLEX MICROSCOPIC - Abnormal; Notable for the following components:   Color, Urine STRAW (*)    Glucose,  UA >=500 (*)    Hgb urine dipstick SMALL (*)    All other components within normal limits  BASIC METABOLIC PANEL - Abnormal; Notable for the following components:   Sodium 133 (*)    Glucose, Bld 452 (*)    BUN 21 (*)    All other components within normal limits  BLOOD GAS, VENOUS - Abnormal; Notable for the following components:   pH, Ven 7.46 (*)    pCO2, Ven 38 (*)    pO2, Ven 176 (*)    Acid-Base Excess 3.1 (*)    All other components within normal limits  CBG MONITORING, ED - Abnormal; Notable for the following components:   Glucose-Capillary 429 (*)    All other components within normal limits    EKG None  Radiology No results found.  Procedures Procedures    Medications Ordered in ED Medications  metFORMIN (GLUCOPHAGE) tablet 500 mg (has no administration in time range)  sodium chloride 0.9 % bolus 1,000 mL (1,000 mLs Intravenous New Bag/Given 07/29/22 1822)    ED Course/ Medical Decision Making/ A&P Clinical Course as of 07/29/22 1920  Wed Jul 29, 2022  1712 CBG monitoring, ED(!) CBG increased at 429 [JK]  1830 Basic metabolic panel(!) Hyperglycemia noted no acidosis [JK]  1831 CBC with Differential(!) Normal [JK]    Clinical Course User Index [JK] Linwood Dibbles, MD                           Medical Decision Making Problems Addressed: Hyperglycemia: acute illness or injury that poses a threat to life or bodily functions  Amount and/or Complexity of Data Reviewed Labs: ordered. Decision-making details documented in ED Course.  Risk Prescription drug management. Risk Details: Patient presented to the ED for elevation of blood sugar.  Concerned about the possibility of DKA.  Fortunately no signs of acidosis or elevated anion gap.  Patient has not been on medications for a while and has been diet controlled.  I suspect the prednisone has caused this recent increase in his blood sugar.  Patient was to be on metformin.  We do need to monitor for hypoglycemia  with his HIV meds but will start him on a low dose and have him monitor closely.  Patient likely can discontinue his metformin once he stops the prednisone but we will have him follow-up with his doctor to reassess.  Final Clinical Impression(s) / ED Diagnoses Final diagnoses:  Hyperglycemia    Rx / DC Orders ED Discharge Orders          Ordered    metFORMIN (GLUCOPHAGE) 500 MG tablet  Daily with breakfast        07/29/22 Zachery Conch, MD 07/29/22 1920

## 2022-07-30 DIAGNOSIS — E1165 Type 2 diabetes mellitus with hyperglycemia: Secondary | ICD-10-CM | POA: Diagnosis not present

## 2022-07-30 DIAGNOSIS — L0292 Furuncle, unspecified: Secondary | ICD-10-CM | POA: Diagnosis not present

## 2022-08-03 ENCOUNTER — Telehealth: Payer: Self-pay

## 2022-08-03 NOTE — Patient Outreach (Addendum)
  Received a call from Mr. Corbit, who received an [facility] ED Discharge call.   He stated he would like a return call from a [place] he has some concerns.    [facility], [place], [place] [facility] [phone]

## 2022-10-26 DIAGNOSIS — R945 Abnormal results of liver function studies: Secondary | ICD-10-CM | POA: Diagnosis not present

## 2022-10-26 DIAGNOSIS — E1165 Type 2 diabetes mellitus with hyperglycemia: Secondary | ICD-10-CM | POA: Diagnosis not present
